# Patient Record
Sex: Male | Born: 1975 | Hispanic: No | Smoking: Never smoker
Health system: Southern US, Community
[De-identification: ages and names within clinical notes are randomized; demographics above are authoritative.]

## PROBLEM LIST (undated history)

## (undated) DIAGNOSIS — R569 Unspecified convulsions: Secondary | ICD-10-CM

## (undated) HISTORY — PX: FINGER SURGERY: SHX640

---

## 2019-07-18 ENCOUNTER — Other Ambulatory Visit (HOSPITAL_BASED_OUTPATIENT_CLINIC_OR_DEPARTMENT_OTHER): Payer: Self-pay

## 2019-07-18 DIAGNOSIS — R0683 Snoring: Secondary | ICD-10-CM

## 2019-07-18 DIAGNOSIS — N529 Male erectile dysfunction, unspecified: Secondary | ICD-10-CM

## 2019-07-18 DIAGNOSIS — R5383 Other fatigue: Secondary | ICD-10-CM

## 2019-08-08 ENCOUNTER — Other Ambulatory Visit (HOSPITAL_BASED_OUTPATIENT_CLINIC_OR_DEPARTMENT_OTHER): Payer: Self-pay

## 2019-08-08 ENCOUNTER — Ambulatory Visit (HOSPITAL_BASED_OUTPATIENT_CLINIC_OR_DEPARTMENT_OTHER): Payer: Self-pay | Admitting: Internal Medicine

## 2019-08-08 DIAGNOSIS — G4733 Obstructive sleep apnea (adult) (pediatric): Secondary | ICD-10-CM

## 2019-08-09 ENCOUNTER — Other Ambulatory Visit (HOSPITAL_COMMUNITY)
Admission: RE | Admit: 2019-08-09 | Discharge: 2019-08-09 | Disposition: A | Discharge status: Home / Self Care | Source: Ambulatory Visit | Attending: Internal Medicine | Admitting: Internal Medicine

## 2019-08-09 DIAGNOSIS — Z01812 Encounter for preprocedural laboratory examination: Secondary | ICD-10-CM | POA: Insufficient documentation

## 2019-08-09 DIAGNOSIS — Z20822 Contact with and (suspected) exposure to covid-19: Secondary | ICD-10-CM | POA: Diagnosis not present

## 2019-08-09 LAB — SARS CORONAVIRUS 2 (TAT 6-24 HRS): SARS Coronavirus 2: NEGATIVE

## 2019-08-11 ENCOUNTER — Ambulatory Visit (HOSPITAL_BASED_OUTPATIENT_CLINIC_OR_DEPARTMENT_OTHER): Attending: Family Medicine | Admitting: Internal Medicine

## 2019-08-11 ENCOUNTER — Other Ambulatory Visit: Payer: Self-pay

## 2019-08-11 DIAGNOSIS — N529 Male erectile dysfunction, unspecified: Secondary | ICD-10-CM | POA: Insufficient documentation

## 2019-08-11 DIAGNOSIS — R0683 Snoring: Secondary | ICD-10-CM | POA: Insufficient documentation

## 2019-08-11 DIAGNOSIS — R5383 Other fatigue: Secondary | ICD-10-CM | POA: Insufficient documentation

## 2019-08-11 DIAGNOSIS — I493 Ventricular premature depolarization: Secondary | ICD-10-CM | POA: Diagnosis not present

## 2019-08-11 DIAGNOSIS — G4733 Obstructive sleep apnea (adult) (pediatric): Secondary | ICD-10-CM

## 2019-08-13 DIAGNOSIS — R0683 Snoring: Secondary | ICD-10-CM | POA: Diagnosis not present

## 2019-08-13 NOTE — Procedures (Signed)
   Patient Name: Jose Holland, Jose Holland Date: 08/11/2019 Gender: Male D.O.B: 03/02/1976 Age (years): 43 Referring Provider: Leilani Able Height (inches): 71 Interpreting Physician: Jetty Duhamel MD, ABSM Weight (lbs): 239 RPSGT: Shelah Lewandowsky BMI: 34 MRN: [id number] Neck Size: 18.00  CLINICAL INFORMATION Sleep Study Type: NPSG Indication for sleep study: Fatigue, Obesity, Snoring Epworth Sleepiness Score: 13  SLEEP STUDY TECHNIQUE As per the AASM Manual for the Scoring of Sleep and Associated Events v2.3 (April 2016) with a hypopnea requiring 4% desaturations.  The channels recorded and monitored were frontal, central and occipital EEG, electrooculogram (EOG), submentalis EMG (chin), nasal and oral airflow, thoracic and abdominal wall motion, anterior tibialis EMG, snore microphone, electrocardiogram, and pulse oximetry.  MEDICATIONS Medications self-administered by patient taken the night of the study : none reported  SLEEP ARCHITECTURE The study was initiated at 9:52:30 PM and ended at 4:43:07 AM.  Sleep onset time was 17.4 minutes and the sleep efficiency was 67.9%%. The total sleep time was 279 minutes.  Stage REM latency was 141.5 minutes.  The patient spent 11.1%% of the night in stage N1 sleep, 62.4%% in stage N2 sleep, 0.0%% in stage N3 and 26.5% in REM.  Alpha intrusion was absent.  Supine sleep was 34.23%.  RESPIRATORY PARAMETERS The overall apnea/hypopnea index (AHI) was 0.0 per hour. There were 0 total apneas, including 0 obstructive, 0 central and 0 mixed apneas. There were 0 hypopneas and 40 RERAs.  The AHI during Stage REM sleep was 0.0 per hour.  AHI while supine was 0.0 per hour.  The mean oxygen saturation was 97.0%. The minimum SpO2 during sleep was 94.0%.  moderate snoring was noted during this study.  CARDIAC DATA The 2 lead EKG demonstrated sinus rhythm. The mean heart rate was 64.5 beats per minute. Other EKG findings include: PVCs.  LEG  MOVEMENT DATA The total PLMS were 0 with a resulting PLMS index of 0.0. Associated arousal with leg movement index was 0.0 .  IMPRESSIONS - No significant obstructive sleep apnea occurred during this study (AHI = 0.0/h). - No significant central sleep apnea occurred during this study (CAI = 0.0/h). - The patient had minimal or no oxygen desaturation during the study (Min O2 = 94.0%) - The patient snored with moderate snoring volume. - EKG findings include PVCs. - Clinically significant periodic limb movements did not occur during sleep. No significant associated arousals.  DIAGNOSIS - Normal study  RECOMMENDATIONS - Manage for symptoms based on clinical judgment - Sleep hygiene should be reviewed to assess factors that may improve sleep quality. - Weight management and regular exercise should be initiated or continued if appropriate.  [Electronically signed] 08/13/2019 04:34 PM  Jetty Duhamel MD, ABSM Diplomate, American Board of Sleep Medicine   NPI: [id number]                          Jetty Duhamel Diplomate, American Board of Sleep Medicine  ELECTRONICALLY SIGNED ON:  08/13/2019, 4:32 PM [facility] PH: [phone]   FX: [phone] ACCREDITED BY THE AMERICAN ACADEMY OF SLEEP MEDICINE

## 2021-11-12 ENCOUNTER — Emergency Department (HOSPITAL_BASED_OUTPATIENT_CLINIC_OR_DEPARTMENT_OTHER): Admitting: Radiology

## 2021-11-12 ENCOUNTER — Encounter (HOSPITAL_BASED_OUTPATIENT_CLINIC_OR_DEPARTMENT_OTHER): Payer: Self-pay

## 2021-11-12 ENCOUNTER — Emergency Department (HOSPITAL_BASED_OUTPATIENT_CLINIC_OR_DEPARTMENT_OTHER)
Admission: EM | Admit: 2021-11-12 | Discharge: 2021-11-12 | Disposition: A | Discharge status: Home / Self Care | Attending: Emergency Medicine | Admitting: Emergency Medicine

## 2021-11-12 ENCOUNTER — Other Ambulatory Visit: Payer: Self-pay

## 2021-11-12 ENCOUNTER — Emergency Department (HOSPITAL_BASED_OUTPATIENT_CLINIC_OR_DEPARTMENT_OTHER)

## 2021-11-12 DIAGNOSIS — S6991XA Unspecified injury of right wrist, hand and finger(s), initial encounter: Secondary | ICD-10-CM | POA: Diagnosis not present

## 2021-11-12 DIAGNOSIS — R569 Unspecified convulsions: Secondary | ICD-10-CM

## 2021-11-12 DIAGNOSIS — M545 Low back pain, unspecified: Secondary | ICD-10-CM

## 2021-11-12 DIAGNOSIS — S0990XA Unspecified injury of head, initial encounter: Secondary | ICD-10-CM | POA: Diagnosis not present

## 2021-11-12 DIAGNOSIS — S3992XA Unspecified injury of lower back, initial encounter: Secondary | ICD-10-CM | POA: Diagnosis present

## 2021-11-12 DIAGNOSIS — Y92410 Unspecified street and highway as the place of occurrence of the external cause: Secondary | ICD-10-CM | POA: Diagnosis not present

## 2021-11-12 DIAGNOSIS — S59901A Unspecified injury of right elbow, initial encounter: Secondary | ICD-10-CM | POA: Insufficient documentation

## 2021-11-12 DIAGNOSIS — M79641 Pain in right hand: Secondary | ICD-10-CM

## 2021-11-12 DIAGNOSIS — H538 Other visual disturbances: Secondary | ICD-10-CM | POA: Diagnosis not present

## 2021-11-12 DIAGNOSIS — M25521 Pain in right elbow: Secondary | ICD-10-CM

## 2021-11-12 DIAGNOSIS — R519 Headache, unspecified: Secondary | ICD-10-CM

## 2021-11-12 HISTORY — DX: Unspecified convulsions: R56.9

## 2021-11-12 MED ORDER — CYCLOBENZAPRINE HCL 10 MG PO TABS: 10.0000 mg | ORAL_TABLET | Freq: Every evening | ORAL | 0 refills | Status: AC | PRN
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
## 2021-11-12 NOTE — Discharge Instructions (Addendum)
I am prescribing you a strong muscle relaxer called flexeril. Please only take this medication once in the evening with dinner. This medication can make you quite drowsy. Do not mix it with alcohol. Do not drive a vehicle after taking it.  ? ?Below is the contact information for Dr. Tamera Punt.  He is a Patent examiner.  Please give them a call and schedule an appointment for reevaluation if your symptoms persist. ? ?Please return to the emergency department with any new or worsening symptoms. ?

## 2021-11-12 NOTE — ED Notes (Signed)
Patient verbalizes understanding of discharge instructions. Opportunity for questioning and answers were provided. Patient discharged from ED.  °
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
## 2023-04-22 IMAGING — DX DG ELBOW COMPLETE 3+V*R*
4 series · 4 of 4 positions shown · non-contrast
Comparison: None.

CLINICAL DATA: right elbow pain

EXAM:
RIGHT ELBOW - COMPLETE 3+ VIEW

[elbow ap]
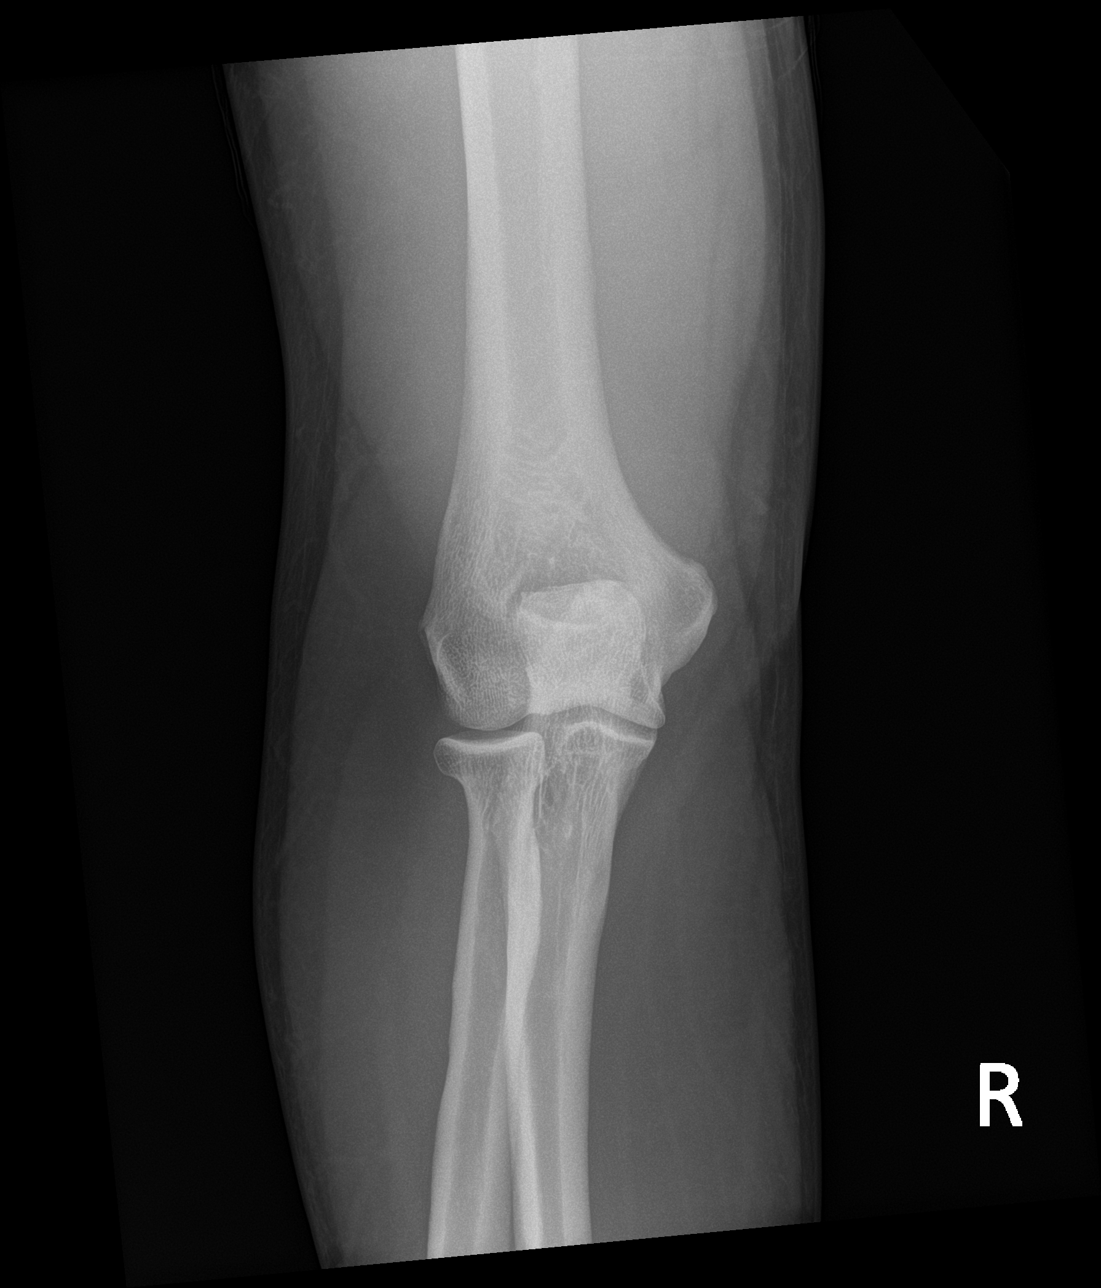

[elbow obl (1 of 2)]
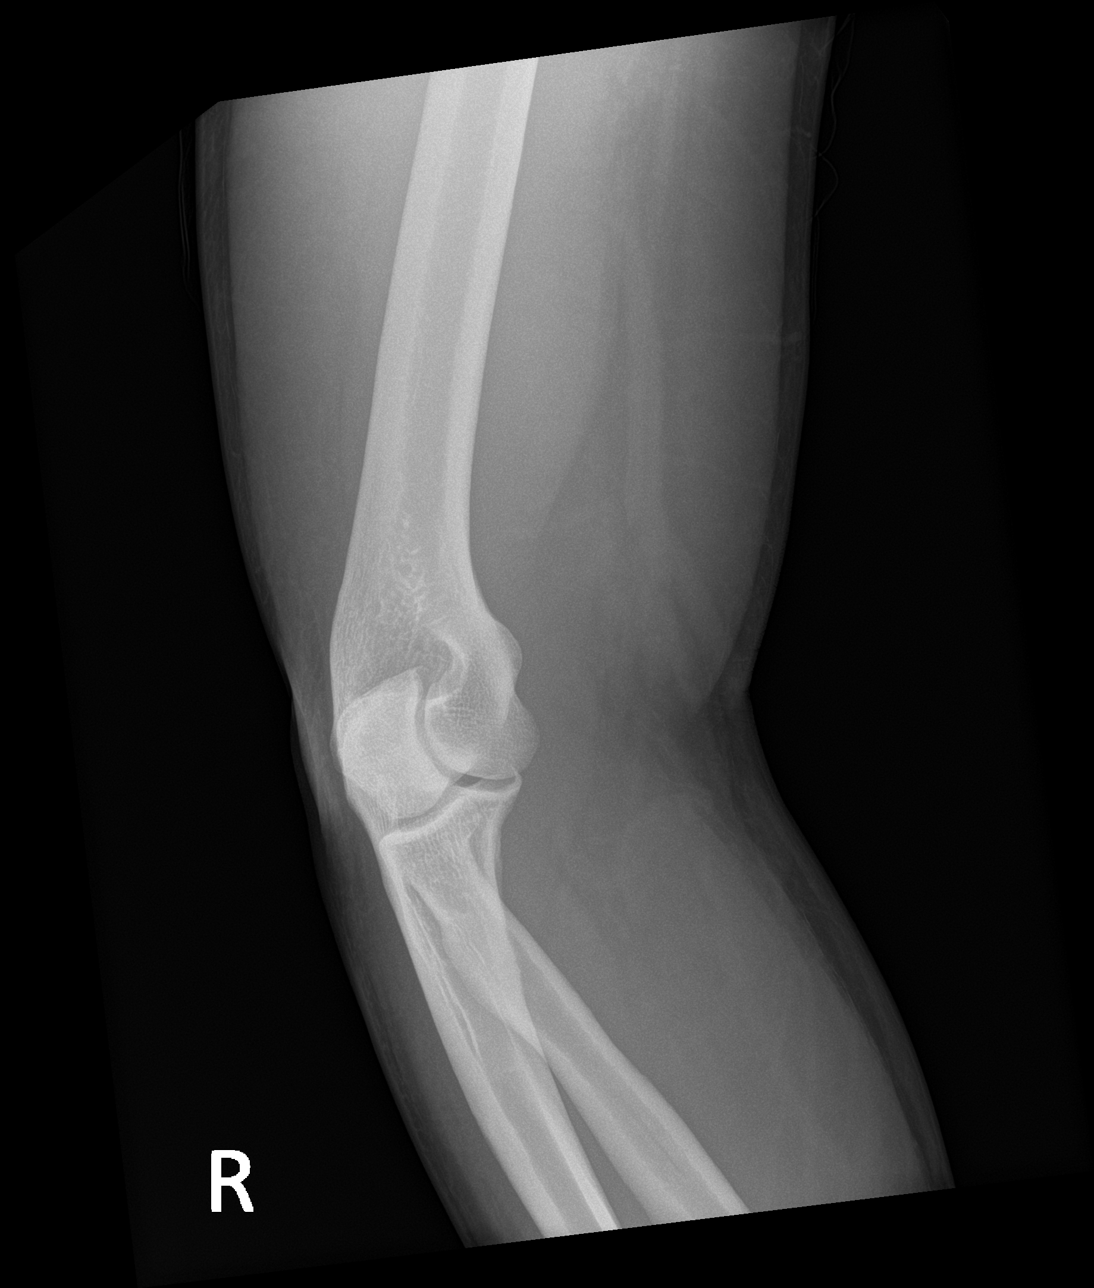

[elbow obl (2 of 2)]
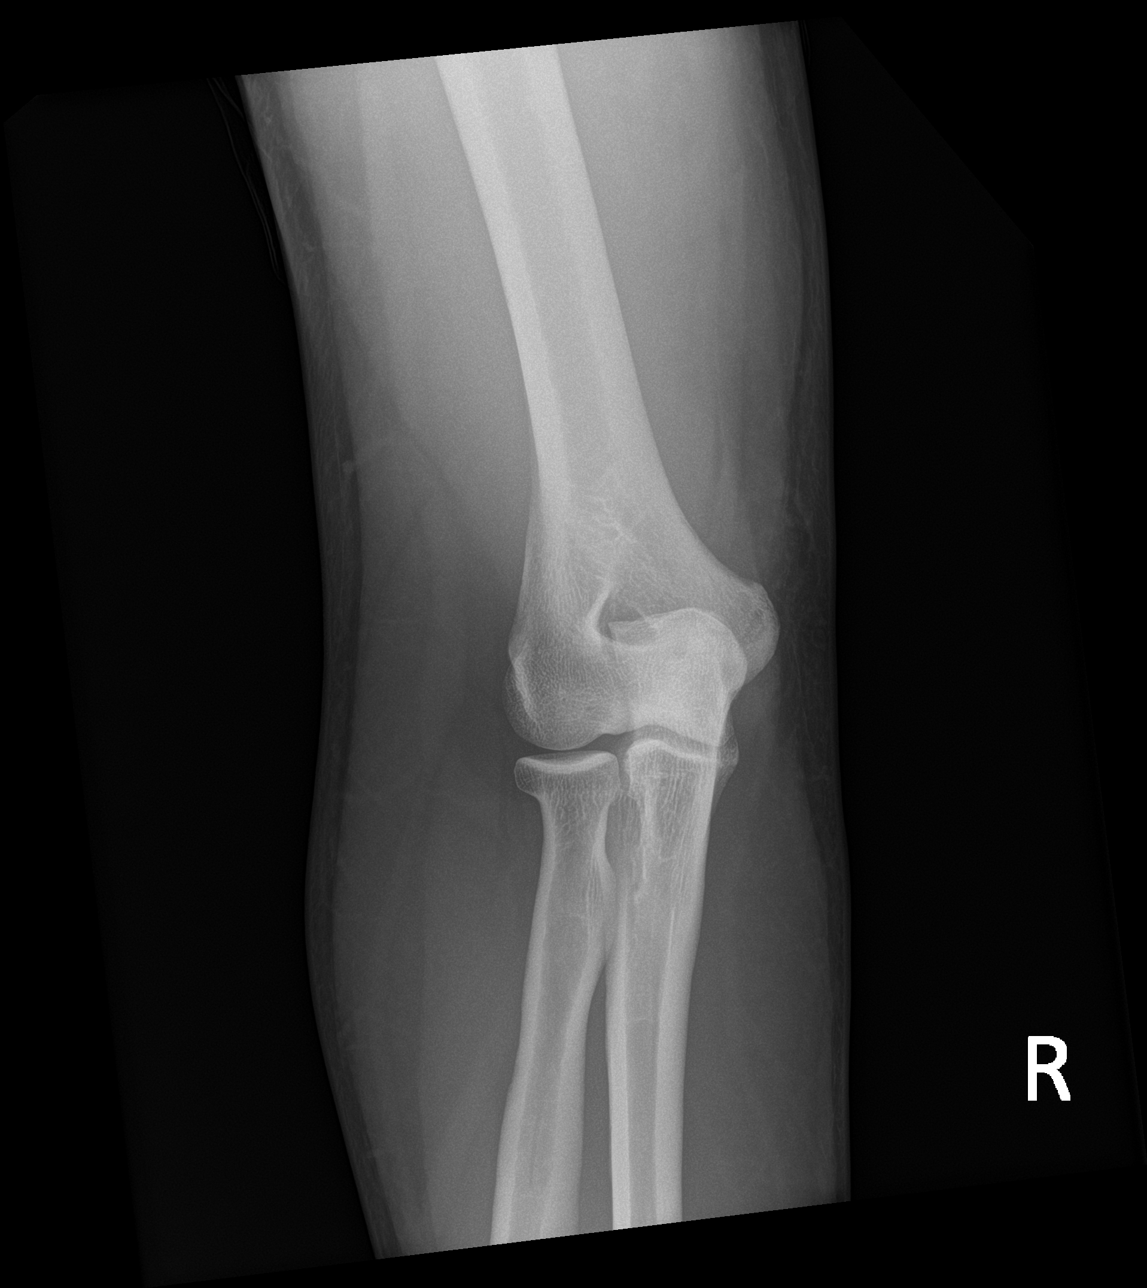

[elbow lat]
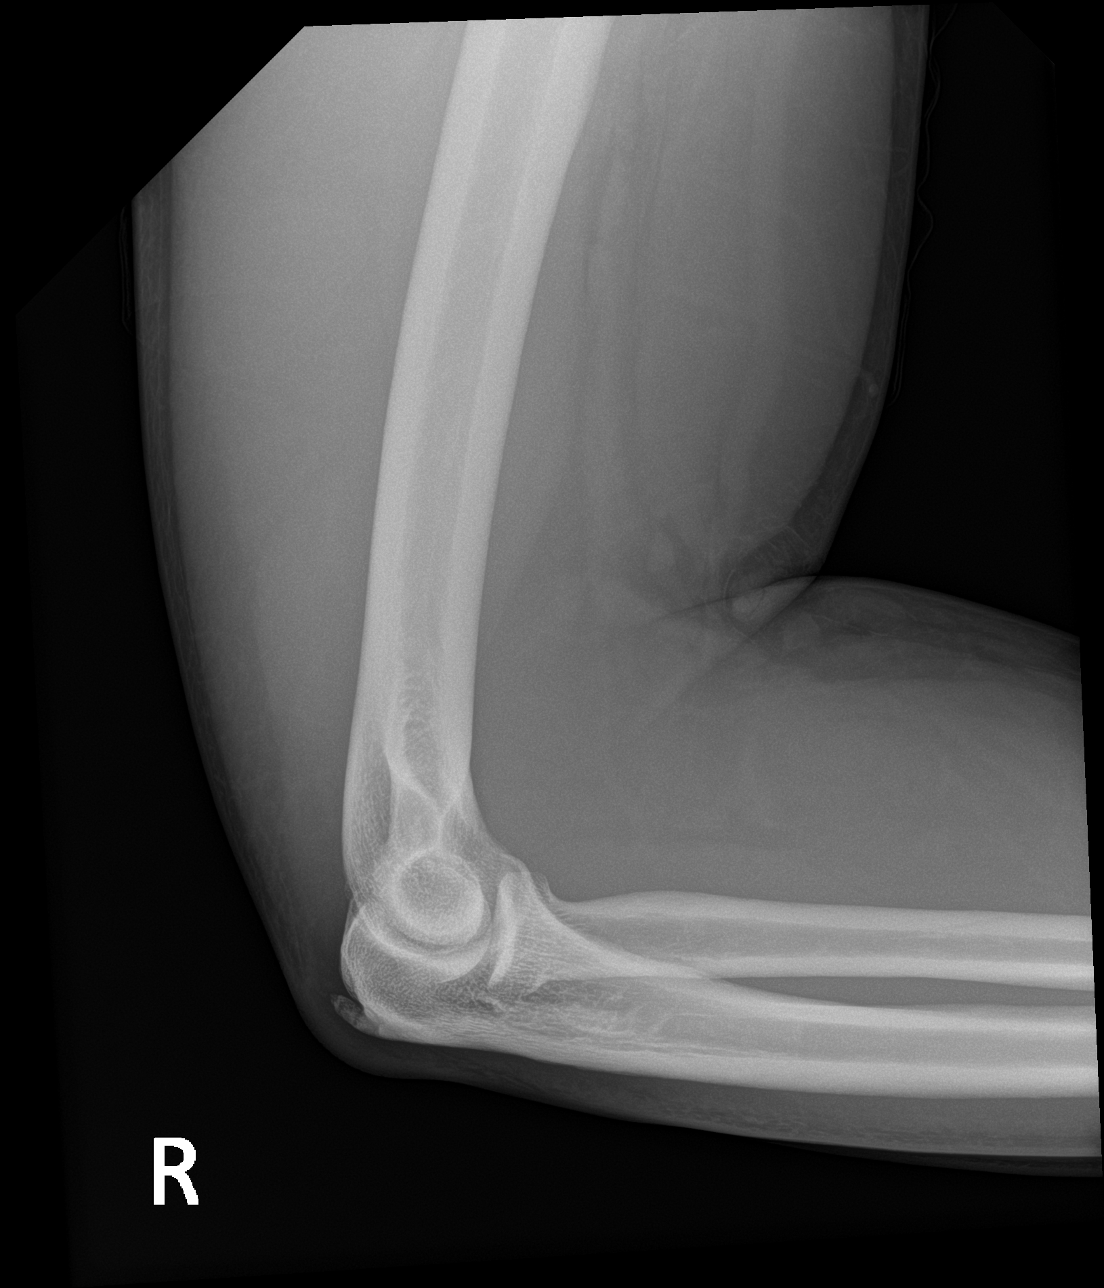

[4 of 4 positions shown; findings below may reference images not displayed]

FINDINGS: There is no evidence of fracture, dislocation, or joint effusion.
There is no evidence of arthropathy or other focal bone abnormality.
Soft tissues are unremarkable.
IMPRESSION: Negative.
